# Patient Record
Sex: Male | Born: 1985 | Race: White | Hispanic: No | Marital: Married | Smoking: Former smoker
Health system: Southern US, Community
[De-identification: ages and names within clinical notes are randomized; demographics above are authoritative.]

---

## 2016-04-20 DIAGNOSIS — J351 Hypertrophy of tonsils: Secondary | ICD-10-CM | POA: Insufficient documentation

## 2016-04-20 DIAGNOSIS — R0683 Snoring: Secondary | ICD-10-CM | POA: Insufficient documentation

## 2016-06-19 DIAGNOSIS — J0100 Acute maxillary sinusitis, unspecified: Secondary | ICD-10-CM | POA: Diagnosis not present

## 2016-06-23 DIAGNOSIS — J320 Chronic maxillary sinusitis: Secondary | ICD-10-CM | POA: Diagnosis not present

## 2016-06-23 DIAGNOSIS — T360X5A Adverse effect of penicillins, initial encounter: Secondary | ICD-10-CM | POA: Diagnosis not present

## 2016-06-23 DIAGNOSIS — R197 Diarrhea, unspecified: Secondary | ICD-10-CM | POA: Diagnosis not present

## 2016-07-12 DIAGNOSIS — J020 Streptococcal pharyngitis: Secondary | ICD-10-CM | POA: Diagnosis not present

## 2016-09-08 DIAGNOSIS — J019 Acute sinusitis, unspecified: Secondary | ICD-10-CM | POA: Diagnosis not present

## 2016-09-08 DIAGNOSIS — J029 Acute pharyngitis, unspecified: Secondary | ICD-10-CM | POA: Diagnosis not present

## 2016-11-16 DIAGNOSIS — J3489 Other specified disorders of nose and nasal sinuses: Secondary | ICD-10-CM | POA: Diagnosis not present

## 2016-11-16 DIAGNOSIS — K529 Noninfective gastroenteritis and colitis, unspecified: Secondary | ICD-10-CM | POA: Diagnosis not present

## 2016-11-17 ENCOUNTER — Other Ambulatory Visit: Payer: Self-pay | Admitting: Physician Assistant

## 2016-11-17 DIAGNOSIS — J329 Chronic sinusitis, unspecified: Secondary | ICD-10-CM

## 2016-11-24 ENCOUNTER — Inpatient Hospital Stay
Admission: RE | Admit: 2016-11-24 | Discharge: 2016-11-24 | Disposition: A | Discharge status: Home / Self Care | Payer: Self-pay | Source: Ambulatory Visit | Attending: Physician Assistant | Admitting: Physician Assistant

## 2017-01-03 DIAGNOSIS — J029 Acute pharyngitis, unspecified: Secondary | ICD-10-CM | POA: Diagnosis not present

## 2017-07-26 DIAGNOSIS — R140 Abdominal distension (gaseous): Secondary | ICD-10-CM | POA: Diagnosis not present

## 2017-07-26 DIAGNOSIS — R197 Diarrhea, unspecified: Secondary | ICD-10-CM | POA: Diagnosis not present

## 2017-08-24 DIAGNOSIS — R21 Rash and other nonspecific skin eruption: Secondary | ICD-10-CM | POA: Diagnosis not present

## 2017-08-24 DIAGNOSIS — L239 Allergic contact dermatitis, unspecified cause: Secondary | ICD-10-CM | POA: Diagnosis not present

## 2017-08-24 DIAGNOSIS — K580 Irritable bowel syndrome with diarrhea: Secondary | ICD-10-CM | POA: Diagnosis not present

## 2017-09-11 DIAGNOSIS — J019 Acute sinusitis, unspecified: Secondary | ICD-10-CM | POA: Diagnosis not present

## 2018-03-09 DIAGNOSIS — J069 Acute upper respiratory infection, unspecified: Secondary | ICD-10-CM | POA: Diagnosis not present

## 2018-03-09 DIAGNOSIS — J019 Acute sinusitis, unspecified: Secondary | ICD-10-CM | POA: Diagnosis not present

## 2019-02-27 ENCOUNTER — Other Ambulatory Visit: Payer: Self-pay

## 2019-02-27 DIAGNOSIS — Z20822 Contact with and (suspected) exposure to covid-19: Secondary | ICD-10-CM

## 2019-02-28 LAB — NOVEL CORONAVIRUS, NAA: SARS-CoV-2, NAA: NOT DETECTED

## 2020-02-10 ENCOUNTER — Ambulatory Visit: Payer: Self-pay | Attending: Internal Medicine

## 2020-02-12 ENCOUNTER — Ambulatory Visit: Payer: Self-pay | Attending: Internal Medicine

## 2020-02-12 DIAGNOSIS — Z23 Encounter for immunization: Secondary | ICD-10-CM

## 2020-02-12 NOTE — Progress Notes (Signed)
   Covid-19 Vaccination Clinic  Name:  Brett Moody    MRN: [id number] DOB: 1985-07-07  02/12/2020  Brett Moody was observed post Covid-19 immunization for 15 minutes without incident. He was provided with Vaccine Information Sheet and instruction to access the V-Safe system.   Brett Moody was instructed to call 911 with any severe reactions post vaccine: Marland Kitchen Difficulty breathing  . Swelling of face and throat  . A fast heartbeat  . A bad rash all over body  . Dizziness and weakness   Immunizations Administered    Name Date Dose VIS Date Route   Pfizer COVID-19 Vaccine 02/12/2020  2:23 PM 0.3 mL 08/13/2018 Intramuscular   Manufacturer: [employer], [employer]   Lot: [id number]   NDC: [address]-6

## 2020-03-04 ENCOUNTER — Ambulatory Visit: Payer: Self-pay

## 2020-10-15 ENCOUNTER — Other Ambulatory Visit: Payer: Self-pay | Admitting: Nurse Practitioner

## 2020-10-15 ENCOUNTER — Other Ambulatory Visit: Payer: Self-pay

## 2020-10-15 ENCOUNTER — Ambulatory Visit
Admission: RE | Admit: 2020-10-15 | Discharge: 2020-10-15 | Disposition: A | Discharge status: Home / Self Care | Payer: Worker's Compensation | Source: Ambulatory Visit | Attending: Nurse Practitioner | Admitting: Nurse Practitioner

## 2020-10-15 DIAGNOSIS — T1490XA Injury, unspecified, initial encounter: Secondary | ICD-10-CM

## 2022-09-03 IMAGING — CR DG FINGER INDEX 2+V*L*
3 series · 3 of 3 positions shown · non-contrast
Comparison: None.

CLINICAL DATA: Pain

EXAM:
LEFT INDEX FINGER 2+V

[x finger pa left]
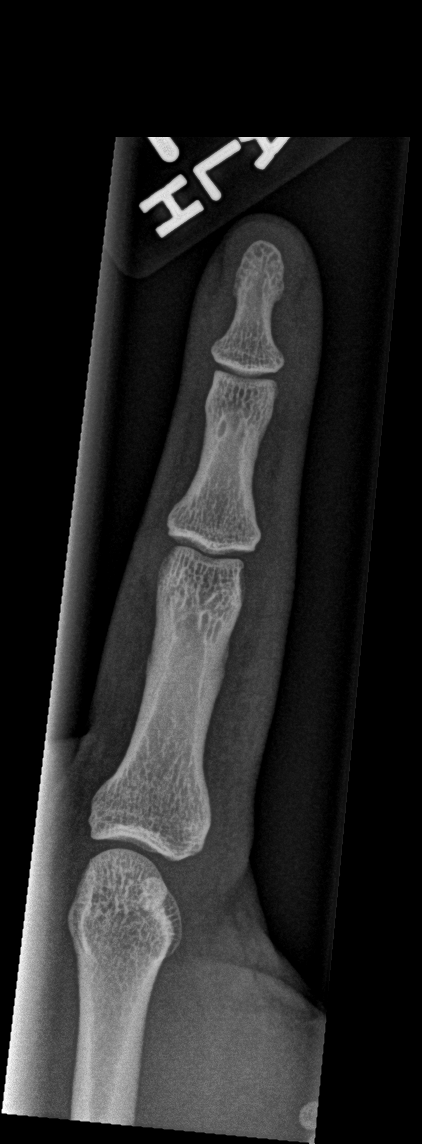

[x finger obl left]
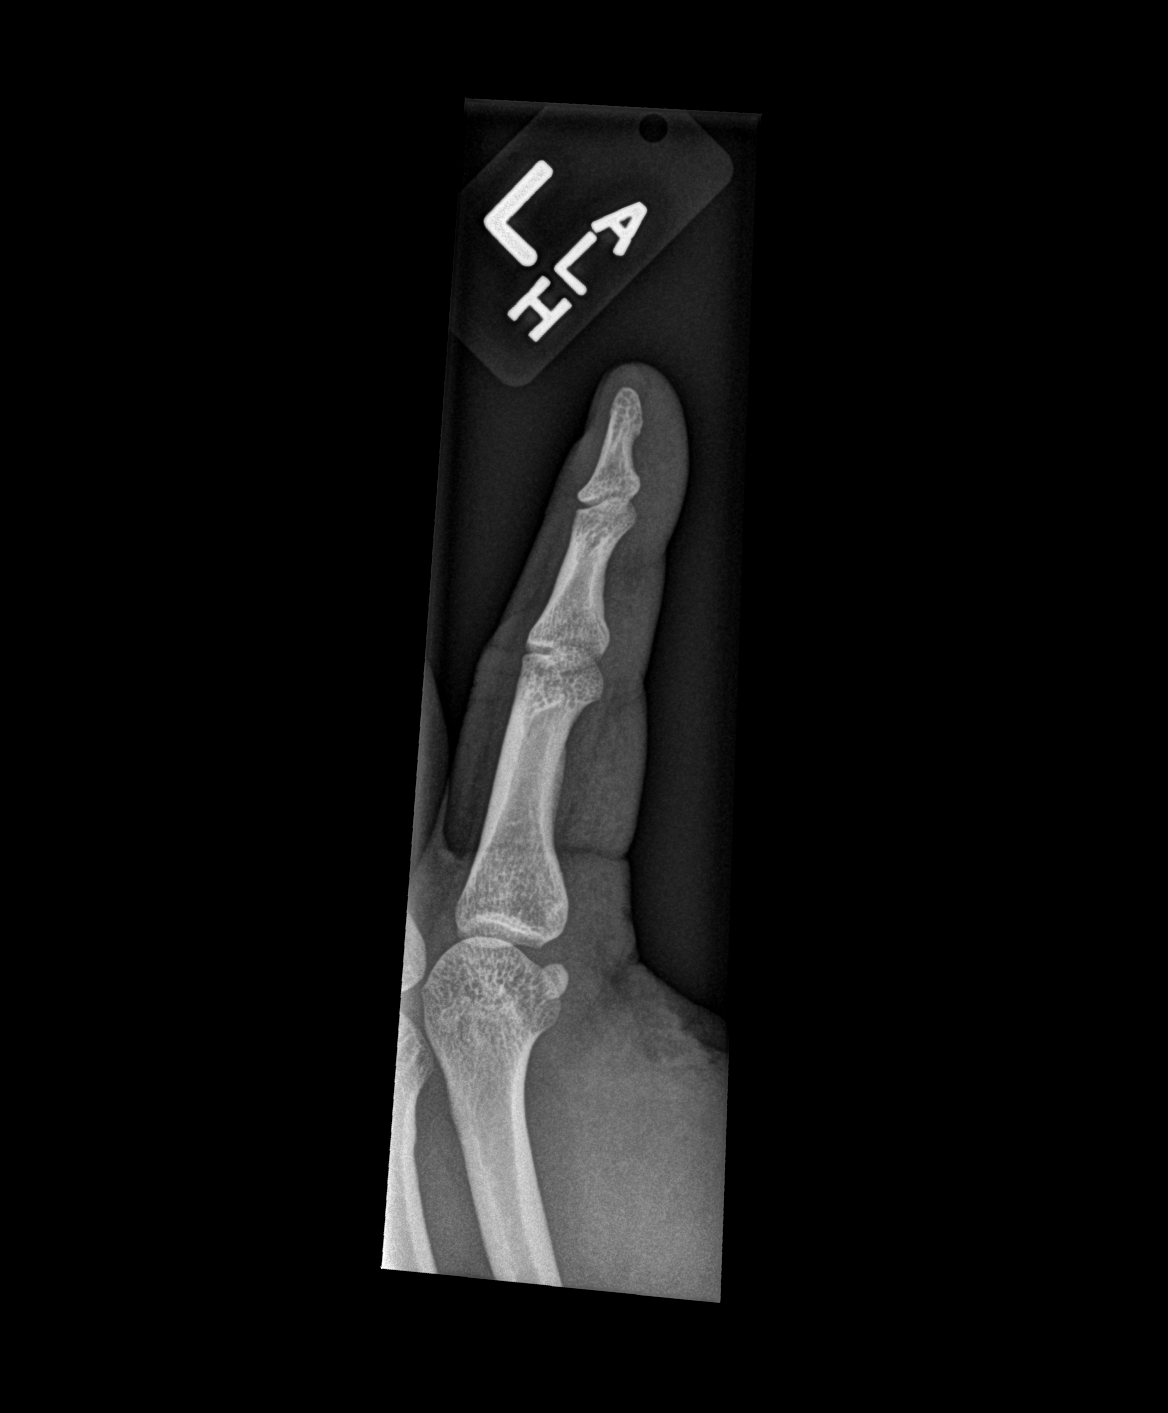

[x finger lat left]
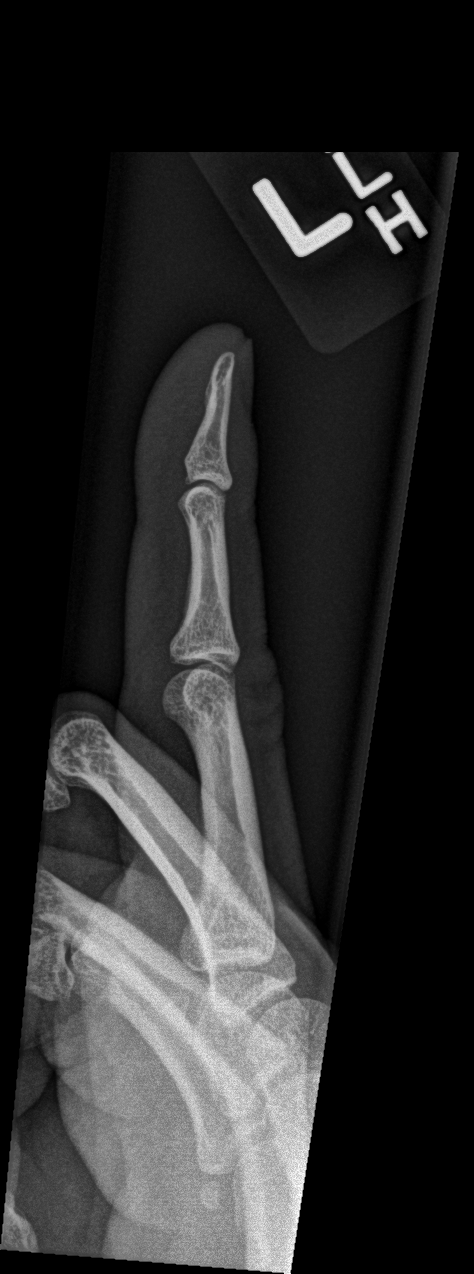

[3 of 3 positions shown; findings below may reference images not displayed]

FINDINGS: There is no evidence of fracture or dislocation. There is no
evidence of arthropathy or other focal bone abnormality. Soft
tissues are unremarkable.
IMPRESSION: Negative.

## 2023-03-27 ENCOUNTER — Ambulatory Visit: Payer: 59 | Admitting: Physician Assistant

## 2023-03-27 ENCOUNTER — Encounter: Payer: Self-pay | Admitting: Physician Assistant

## 2023-03-27 VITALS — BP 134/94 | HR 80 | Ht 71.5 in | Wt 269.0 lb

## 2023-03-27 DIAGNOSIS — E6609 Other obesity due to excess calories: Secondary | ICD-10-CM

## 2023-03-27 DIAGNOSIS — Z6836 Body mass index (BMI) 36.0-36.9, adult: Secondary | ICD-10-CM

## 2023-03-27 DIAGNOSIS — E66812 Obesity, class 2: Secondary | ICD-10-CM

## 2023-03-27 DIAGNOSIS — Z0000 Encounter for general adult medical examination without abnormal findings: Secondary | ICD-10-CM

## 2023-03-27 DIAGNOSIS — R030 Elevated blood-pressure reading, without diagnosis of hypertension: Secondary | ICD-10-CM

## 2023-03-27 DIAGNOSIS — R7303 Prediabetes: Secondary | ICD-10-CM

## 2023-03-27 NOTE — Progress Notes (Unsigned)
   New Patient Office Visit  Subjective    Patient ID: Brett Moody, male    DOB: June 16, 1986  Age: 37 y.o. MRN: [id number]  CC:  Chief Complaint  Patient presents with   Annual Exam    HPI Brett Moody presents to establish care No oncerns   PCP on 12/4 March  - higher LDL levels  Prediabetes   Daily exercise - active at work  Water intake -  120/80   Outpatient Encounter Medications as of 03/27/2023  Medication Sig   fexofenadine (ALLEGRA ALLERGY) 60 MG tablet Take 60 mg by mouth daily.   FLUoxetine (PROZAC) 20 MG tablet Take 20 mg by mouth daily.   No facility-administered encounter medications on file as of 03/27/2023.    No past medical history on file.  History reviewed. No pertinent surgical history.  Family History  Problem Relation Age of Onset   Diabetes Father    Diabetes Paternal Grandmother    Diabetes Paternal Grandfather     Social History   Socioeconomic History   Marital status: Married    Spouse name: Not on file   Number of children: Not on file   Years of education: Not on file   Highest education level: Not on file  Occupational History   Not on file  Tobacco Use   Smoking status: Former    Types: Cigarettes    Passive exposure: Past   Smokeless tobacco: Not on file  Vaping Use   Vaping status: Never Used  Substance and Sexual Activity   Alcohol use: Yes    Alcohol/week: 1.0 standard drink of alcohol    Types: 1 Cans of beer per week    Comment: Weekly   Drug use: Not on file   Sexual activity: Not on file  Other Topics Concern   Not on file  Social History Narrative   Not on file   Social Determinants of Health   Financial Resource Strain: Not on file  Food Insecurity: Not on file  Transportation Needs: Not on file  Physical Activity: Not on file  Stress: Not on file  Social Connections: Not on file  Intimate Partner Violence: Not on file    ROS      Objective    Ht 5' 11.5" (1.816 m)   Wt 269 lb (122 kg)    BMI 36.99 kg/m   Physical Exam       Assessment & Plan:   Problem List Items Addressed This Visit   None   No follow-ups on file.   Kasandra Knudsen Mayers, PA-C

## 2023-03-27 NOTE — Patient Instructions (Addendum)
Your blood pressure is slightly elevated today, I do encourage you to check it at home, keep a written log and time not available for your office visits with your primary care provider.  Please feel free to return to the mobile unit as needed.  Roney Jaffe, PA-C Physician Assistant [facility] [email or web address]  How to Take Your Blood Pressure Blood pressure is a measurement of how strongly your blood is pressing against the walls of your arteries. Arteries are blood vessels that carry blood from your heart throughout your body. Your health care provider takes your blood pressure at each office visit. You can also take your own blood pressure at home with a blood pressure monitor. You may need to take your own blood pressure to: Confirm a diagnosis of high blood pressure (hypertension). Monitor your blood pressure over time. Make sure your blood pressure medicine is working. Supplies needed: Blood pressure monitor. A chair to sit in. This should be a chair where you can sit upright with your back supported. Do not sit on a soft couch or an armchair. Table or desk. Small notebook and pencil or pen. How to prepare To get the most accurate reading, avoid the following for 30 minutes before you check your blood pressure: Drinking caffeine. Drinking alcohol. Eating. Smoking. Exercising. Five minutes before you check your blood pressure: Use the bathroom and urinate so that you have an empty bladder. Sit quietly in a chair. Do not talk. How to take your blood pressure To check your blood pressure, follow the instructions in the manual that came with your blood pressure monitor. If you have a digital blood pressure monitor, the instructions may be as follows: Sit up straight in a chair. Place your feet on the floor. Do not cross your ankles or legs. Rest your left arm at the level of your heart on a table or desk or on the arm of  a chair. Pull up your shirt sleeve. Wrap the blood pressure cuff around the upper part of your left arm, 1 inch (2.5 cm) above your elbow. It is best to wrap the cuff around bare skin. Fit the cuff snugly, but not too tightly, around your arm. You should be able to place only one finger between the cuff and your arm. Position the cord so that it rests in the bend of your elbow. Press the power button. Sit quietly while the cuff inflates and deflates. Read the digital reading on the monitor screen and write the numbers down (record them) in a notebook. Wait 2-3 minutes, then repeat the steps, starting at step 1. What does my blood pressure reading mean? A blood pressure reading consists of a higher number over a lower number. Ideally, your blood pressure should be below 120/80. The first ("top") number is called the systolic pressure. It is a measure of the pressure in your arteries as your heart beats. The second ("bottom") number is called the diastolic pressure. It is a measure of the pressure in your arteries as the heart relaxes. Blood pressure is classified into four stages. The following are the stages for adults who do not have a short-term serious illness or a chronic condition. Systolic pressure and diastolic pressure are measured in a unit called mm Hg (millimeters of mercury).  Normal Systolic pressure: below 120. Diastolic pressure: below 80. Elevated Systolic pressure: 120-129. Diastolic pressure: below 80. Hypertension stage 1 Systolic pressure: 130-139. Diastolic pressure: 80-89. Hypertension stage 2 Systolic pressure: 140 or above. Diastolic  pressure: 90 or above. You can have elevated blood pressure or hypertension even if only the systolic or only the diastolic number in your reading is higher than normal. Follow these instructions at home: Medicines Take over-the-counter and prescription medicines only as told by your health care provider. Tell your health care provider  if you are having any side effects from blood pressure medicine. General instructions Check your blood pressure as often as recommended by your health care provider. Check your blood pressure at the same time every day. Take your monitor to the next appointment with your health care provider to make sure that: You are using it correctly. It provides accurate readings. Understand what your goal blood pressure numbers are. Keep all follow-up visits. This is important. General tips Your health care provider can suggest a reliable monitor that will meet your needs. There are several types of home blood pressure monitors. Choose a monitor that has an arm cuff. Do not choose a monitor that measures your blood pressure from your wrist or finger. Choose a cuff that wraps snugly, not too tight or too loose, around your upper arm. You should be able to fit only one finger between your arm and the cuff. You can buy a blood pressure monitor at most drugstores or online. Where to find more information American Heart Association: www.heart.org Contact a health care provider if: Your blood pressure is consistently high. Your blood pressure is suddenly low. Get help right away if: Your systolic blood pressure is higher than 180. Your diastolic blood pressure is higher than 120. These symptoms may be an emergency. Get help right away. Call 911. Do not wait to see if the symptoms will go away. Do not drive yourself to the hospital. Summary Blood pressure is a measurement of how strongly your blood is pressing against the walls of your arteries. A blood pressure reading consists of a higher number over a lower number. Ideally, your blood pressure should be below 120/80. Check your blood pressure at the same time every day. Avoid caffeine, alcohol, smoking, and exercise for 30 minutes prior to checking your blood pressure. These agents can affect the accuracy of the blood pressure reading. This information  is not intended to replace advice given to you by your health care provider. Make sure you discuss any questions you have with your health care provider. Document Revised: 02/17/2021 Document Reviewed: 02/17/2021 Elsevier Patient Education  2024 [employer].

## 2023-03-29 ENCOUNTER — Encounter: Payer: Self-pay | Admitting: Physician Assistant

## 2024-04-22 ENCOUNTER — Encounter: Payer: Self-pay | Admitting: Urgent Care

## 2024-04-22 ENCOUNTER — Ambulatory Visit: Admitting: Urgent Care

## 2024-04-22 VITALS — BP 140/92 | HR 78 | Ht 72.0 in | Wt 277.0 lb

## 2024-04-22 DIAGNOSIS — R0683 Snoring: Secondary | ICD-10-CM | POA: Diagnosis not present

## 2024-04-22 DIAGNOSIS — R030 Elevated blood-pressure reading, without diagnosis of hypertension: Secondary | ICD-10-CM

## 2024-04-22 DIAGNOSIS — F329 Major depressive disorder, single episode, unspecified: Secondary | ICD-10-CM | POA: Insufficient documentation

## 2024-04-22 DIAGNOSIS — J3089 Other allergic rhinitis: Secondary | ICD-10-CM

## 2024-04-22 DIAGNOSIS — K580 Irritable bowel syndrome with diarrhea: Secondary | ICD-10-CM | POA: Insufficient documentation

## 2024-04-22 DIAGNOSIS — E785 Hyperlipidemia, unspecified: Secondary | ICD-10-CM | POA: Diagnosis not present

## 2024-04-22 DIAGNOSIS — J351 Hypertrophy of tonsils: Secondary | ICD-10-CM

## 2024-04-22 DIAGNOSIS — J309 Allergic rhinitis, unspecified: Secondary | ICD-10-CM | POA: Insufficient documentation

## 2024-04-22 DIAGNOSIS — B001 Herpesviral vesicular dermatitis: Secondary | ICD-10-CM

## 2024-04-22 DIAGNOSIS — F330 Major depressive disorder, recurrent, mild: Secondary | ICD-10-CM | POA: Diagnosis not present

## 2024-04-22 MED ORDER — FLUOXETINE HCL 10 MG PO CAPS: 10.0000 mg | ORAL_CAPSULE | Freq: Every day | ORAL | 3 refills | Status: AC

## 2024-04-22 MED ORDER — FEXOFENADINE HCL 60 MG PO TABS: 60.0000 mg | ORAL_TABLET | Freq: Every day | ORAL | 3 refills | Status: AC

## 2024-04-22 MED ORDER — VALACYCLOVIR HCL 1 G PO TABS: 2000.0000 mg | ORAL_TABLET | Freq: Two times a day (BID) | ORAL | 5 refills | Status: AC

## 2024-04-22 MED ORDER — FLUOXETINE HCL 20 MG PO TABS
20.0000 mg | ORAL_TABLET | Freq: Every day | ORAL | 1 refills | Status: DC
Start: 2024-04-22 — End: 2024-04-28

## 2024-04-22 NOTE — Progress Notes (Unsigned)
 SABRA

## 2024-04-22 NOTE — Progress Notes (Unsigned)
 New Patient Office Visit  Subjective:  Patient ID: Brett Moody, male    DOB: [birth year]  Age: 38 y.o. MRN: [id number]  CC:  Chief Complaint  Patient presents with   Establish Care    HPI Brett Moody presents to establish care.  Pleasant 38yo male presents to establish care. He does need medication refills. He is on fluoxetine and feels this is working well.   He does have allergies for which he takes allegra daily.  C/o being told his tonsils are enlarged. He admits to snoring.  Does have hx of cold sores, requiring infrequent use of valtrex.  Has elevated BP in office today; has not been on BP meds in the past.   Does have hx of HLD, not on meds   Outpatient Encounter Medications as of 04/22/2024  Medication Sig   FLUoxetine (PROZAC) 10 MG capsule Take 1 capsule (10 mg total) by mouth daily.   methocarbamol (ROBAXIN) 500 MG tablet 1 tablet Orally every 4-6 hrs as needed. Max 3 a day   [DISCONTINUED] fexofenadine (ALLEGRA ALLERGY) 60 MG tablet Take 60 mg by mouth daily.   [DISCONTINUED] FLUoxetine (PROZAC) 20 MG tablet Take 20 mg by mouth daily.   [DISCONTINUED] valACYclovir (VALTREX) 1000 MG tablet Take 2,000 mg by mouth 2 (two) times daily.   fexofenadine (ALLEGRA ALLERGY) 60 MG tablet Take 1 tablet (60 mg total) by mouth daily.   FLUoxetine (PROZAC) 20 MG tablet Take 1 tablet (20 mg total) by mouth daily.   valACYclovir (VALTREX) 1000 MG tablet Take 2 tablets (2,000 mg total) by mouth 2 (two) times daily.   No facility-administered encounter medications on file as of 04/22/2024.    History reviewed. No pertinent past medical history.  History reviewed. No pertinent surgical history.  Family History  Problem Relation Age of Onset   Diabetes Father    Diabetes Paternal Grandmother    Diabetes Paternal Grandfather     Social History   Socioeconomic History   Marital status: Married    Spouse name: Not on file   Number of children: Not on file   Years of  education: Not on file   Highest education level: Not on file  Occupational History   Not on file  Tobacco Use   Smoking status: Former    Types: Cigarettes    Passive exposure: Past   Smokeless tobacco: Never  Vaping Use   Vaping status: Never Used  Substance and Sexual Activity   Alcohol use: Yes    Alcohol/week: 1.0 standard drink of alcohol    Types: 1 Cans of beer per week    Comment: Weekly   Drug use: Not on file   Sexual activity: Not on file  Other Topics Concern   Not on file  Social History Narrative   Not on file   Social Drivers of Health   Financial Resource Strain: Not on file  Food Insecurity: Not on file  Transportation Needs: Not on file  Physical Activity: Not on file  Stress: Not on file  Social Connections: Not on file  Intimate Partner Violence: Not on file    ROS: as noted in HPI  Objective:  BP (!) 140/92   Pulse 78   Ht 6' (1.829 m)   Wt 277 lb (125.6 kg)   SpO2 96%   BMI 37.57 kg/m   Physical Exam    Assessment & Plan:  Mild episode of recurrent major depressive disorder -     FLUoxetine HCl; Take  1 tablet (20 mg total) by mouth daily.  Dispense: 90 tablet; Refill: 1 -     FLUoxetine HCl; Take 1 capsule (10 mg total) by mouth daily.  Dispense: 90 capsule; Refill: 3  Allergic rhinitis due to other allergic trigger, unspecified seasonality -     Fexofenadine HCl; Take 1 tablet (60 mg total) by mouth daily.  Dispense: 90 tablet; Refill: 3  Snoring -     Ambulatory referral to Sleep Studies  Dyslipidemia  Hypertrophy of tonsils -     Ambulatory referral to Sleep Studies  Recurrent cold sores -     valACYclovir HCl; Take 2 tablets (2,000 mg total) by mouth 2 (two) times daily.  Dispense: 30 tablet; Refill: 5  Elevated blood pressure reading  Med refills called in. PT will monitor BP at home over the next week and bring home BP readings to next office visit. Pt will come fasting. Will refer for sleep study given risk  factors.   Return in about 1 week (around 04/29/2024) for Annual Physical.   Benton LITTIE Gave, PA

## 2024-04-22 NOTE — Patient Instructions (Signed)
 Schedule annual PE next week

## 2024-04-28 ENCOUNTER — Other Ambulatory Visit: Payer: Self-pay | Admitting: Urgent Care

## 2024-04-28 ENCOUNTER — Ambulatory Visit: Admitting: Urgent Care

## 2024-04-28 ENCOUNTER — Encounter: Payer: Self-pay | Admitting: Urgent Care

## 2024-04-28 VITALS — BP 122/86 | HR 71

## 2024-04-28 DIAGNOSIS — Z0000 Encounter for general adult medical examination without abnormal findings: Secondary | ICD-10-CM

## 2024-04-28 DIAGNOSIS — Z1159 Encounter for screening for other viral diseases: Secondary | ICD-10-CM

## 2024-04-28 DIAGNOSIS — R7303 Prediabetes: Secondary | ICD-10-CM | POA: Diagnosis not present

## 2024-04-28 DIAGNOSIS — Z114 Encounter for screening for human immunodeficiency virus [HIV]: Secondary | ICD-10-CM

## 2024-04-28 DIAGNOSIS — E782 Mixed hyperlipidemia: Secondary | ICD-10-CM

## 2024-04-28 DIAGNOSIS — E669 Obesity, unspecified: Secondary | ICD-10-CM | POA: Diagnosis not present

## 2024-04-28 DIAGNOSIS — Z23 Encounter for immunization: Secondary | ICD-10-CM | POA: Diagnosis not present

## 2024-04-28 DIAGNOSIS — E8881 Metabolic syndrome: Secondary | ICD-10-CM

## 2024-04-28 DIAGNOSIS — Z6837 Body mass index (BMI) 37.0-37.9, adult: Secondary | ICD-10-CM

## 2024-04-28 MED ORDER — FLUOXETINE HCL 20 MG PO CAPS: 20.0000 mg | ORAL_CAPSULE | Freq: Every day | ORAL | 3 refills | Status: AC

## 2024-04-28 NOTE — Patient Instructions (Addendum)
 Please read the attached handouts.  Please try to incorporate more physical activity into your daily plan.  Return for recheck and follow up in 6 months.

## 2024-04-28 NOTE — Progress Notes (Signed)
 Received Rx request from [facility]. Tablets are NOT covered, only capsules are. Pt takes 30mg  fluoxetine daily. Unfortunately, the 20mg  tablet was called in therefore will switch today to capsules per insurance request. Pt will remain on 10mg  and 20mg  capsules to = 30mg  daily.

## 2024-04-28 NOTE — Progress Notes (Unsigned)
 Annual Wellness Visit     Patient: Brett Moody, Male    DOB: 1985/11/27, 38 y.o.   MRN: [id number]  Subjective  Chief Complaint  Patient presents with   Annual Exam    Brett Moody is a 38 y.o. male who presents today for his Annual Wellness Visit. He reports consuming a general diet but feels he eats out more than he should. The patient does not participate in regular exercise at present. 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 He generally feels fairly well. He reports sleeping fairly well. He does not have additional problems to discuss today.   HPI  Vision:Within last year and Dental: No current dental problems and Receives regular dental care   Patient Active Problem List   Diagnosis Date Noted   Dyslipidemia 04/22/2024   Allergic rhinitis 04/22/2024   Major depression 04/22/2024   Irritable bowel syndrome with diarrhea 04/22/2024   Hypertrophy of tonsils 04/20/2016   Snoring 04/20/2016   History reviewed. No pertinent past medical history. History reviewed. No pertinent surgical history. Social History   Tobacco Use   Smoking status: Former    Types: Cigarettes    Passive exposure: Past   Smokeless tobacco: Never  Vaping Use   Vaping status: Never Used  Substance Use Topics   Alcohol use: Yes    Alcohol/week: 1.0 standard drink of alcohol    Types: 1 Cans of beer per week    Comment: Weekly      Medications: Outpatient Medications Prior to Visit  Medication Sig   fexofenadine (ALLEGRA ALLERGY) 60 MG tablet Take 1 tablet (60 mg total) by mouth daily.   FLUoxetine (PROZAC) 10 MG capsule Take 1 capsule (10 mg total) by mouth daily.   FLUoxetine (PROZAC) 20 MG capsule Take 1 capsule (20 mg total) by mouth daily.   methocarbamol (ROBAXIN) 500 MG tablet 1 tablet Orally every 4-6 hrs as needed. Max 3 a day   valACYclovir (VALTREX) 1000 MG tablet Take 2 tablets (2,000 mg total) by mouth 2 (two) times daily.   No facility-administered  medications prior to visit.    No Known Allergies  Patient Care Team: Lowella Benton CROME, [place] as PCP - General (Physician Assistant)  ROS Complete 12 point ROS performed with all pertinent positives listed in HPI     Objective  BP 122/86   Pulse 71   SpO2 95%  BP Readings from Last 3 Encounters:  04/28/24 122/86  04/22/24 (!) 140/92  03/27/23 (!) 134/94   Wt Readings from Last 3 Encounters:  04/22/24 277 lb (125.6 kg)  03/27/23 269 lb (122 kg)      Physical Exam Vitals and nursing note reviewed. Exam conducted with a chaperone present.  Constitutional:      General: He is not in acute distress.    Appearance: Normal appearance. He is obese. He is not ill-appearing, toxic-appearing or diaphoretic.  HENT:     Head: Normocephalic and atraumatic.     Right Ear: Tympanic membrane, ear canal and external ear normal. There is no impacted cerumen.     Left Ear: Tympanic membrane, ear canal and external ear normal. There is no impacted cerumen.     Nose: Nose normal.     Mouth/Throat:     Mouth: Mucous membranes are moist.     Pharynx: Oropharynx is clear. No oropharyngeal exudate or posterior oropharyngeal erythema.  Eyes:     General: No scleral icterus.  Right eye: No discharge.        Left eye: No discharge.     Extraocular Movements: Extraocular movements intact.     Pupils: Pupils are equal, round, and reactive to light.  Neck:     Thyroid: No thyroid mass, thyromegaly or thyroid tenderness.  Cardiovascular:     Rate and Rhythm: Normal rate and regular rhythm.     Pulses: Normal pulses.     Heart sounds: No murmur heard. Pulmonary:     Effort: Pulmonary effort is normal. No respiratory distress.     Breath sounds: Normal breath sounds. No stridor. No wheezing or rhonchi.  Abdominal:     General: Abdomen is flat. Bowel sounds are normal. There is no distension.     Palpations: Abdomen is soft. There is no mass.     Tenderness: There is no abdominal tenderness.  There is no guarding.  Musculoskeletal:     Cervical back: Normal range of motion and neck supple. No rigidity or tenderness.     Right lower leg: No edema.     Left lower leg: No edema.  Lymphadenopathy:     Cervical: No cervical adenopathy.  Skin:    General: Skin is warm and dry.     Coloration: Skin is not jaundiced.     Findings: No bruising, erythema or rash.  Neurological:     General: No focal deficit present.     Mental Status: He is alert and oriented to person, place, and time.     Sensory: No sensory deficit.     Motor: No weakness.  Psychiatric:        Mood and Affect: Mood normal.        Behavior: Behavior normal.     Most recent depression screenings:    04/28/2024    1:49 PM 04/22/2024    3:32 PM  PHQ 2/9 Scores  PHQ - 2 Score 1 0  PHQ- 9 Score 4 0      Data saved with a previous flowsheet row definition    Fall Screening Falls in the past year?: 0 Number of falls in past year: 0  Fall Risk Fall risk Follow up: Falls evaluation completed  Advance Directives (For Healthcare) Does Patient Have a Medical Advance Directive?: No Would patient like information on creating a medical advance directive?: No - Patient declined   Vision/Hearing Screen: No results found.   LDL - 137 TG  - 256 HDL - 29 Total cholesterol - 217 A1C - 6.1%   No results found for any visits on 04/28/24.    Assessment & Plan   Annual wellness visit done today including the all of the following: Reviewed patient's Family and Medical History Reviewed and updated list of patient's medical providers Assessment of cognitive impairment was done Assessed patient's functional ability Established a written schedule for health screening services Health Risk Assessent Completed and Reviewed  Exercise Activities and Dietary recommendations  Goals   Current goal - cut down to less than 2 sodas a day     Immunization History  Administered Date(s) Administered    PFIZER(Purple Top)SARS-COV-2 Vaccination 02/12/2020   Tdap 04/28/2024    Health Maintenance  Topic Date Due   HIV Screening  Never done   Hepatitis C Screening  Never done   COVID-19 Vaccine (3 - 2025-26 season) 05/09/2024 (Originally 02/18/2024)   Influenza Vaccine  09/16/2024 (Originally 01/18/2024)   Hepatitis B Vaccines 19-59 Average Risk (1 of 3 - 19+ 3-dose series) 04/28/2025 (  Originally 10/29/2003)   DTaP/Tdap/Td (2 - Td or Tdap) 04/28/2034   Pneumococcal Vaccine  Aged Out   Meningococcal B Vaccine  Aged Out   HPV VACCINES  Discontinued     Discussed health benefits of physical activity, and encouraged him to engage in regular exercise appropriate for his age and condition.    Problem List Items Addressed This Visit   None Visit Diagnoses       Routine adult health maintenance    -  Primary   Relevant Orders   CBC with Differential/Platelet   Hemoglobin A1c   TSH   Comprehensive metabolic panel with GFR     Need for diphtheria-tetanus-pertussis (Tdap) vaccine       Relevant Orders   Tdap vaccine greater than or equal to 7yo IM (Completed)     Encounter for screening for HIV       Relevant Orders   HIV antibody (with reflex)     Need for hepatitis C screening test       Relevant Orders   Hepatitis C Antibody     Prediabetes       Relevant Orders   Hemoglobin A1c   Comprehensive metabolic panel with GFR     Abdominal obesity and metabolic syndrome       Relevant Orders   Comprehensive metabolic panel with GFR     BMI 37.0-37.9, adult       Relevant Orders   Hemoglobin A1c     Mixed hyperlipidemia       Relevant Orders   Hemoglobin A1c   Comprehensive metabolic panel with GFR      Assessment and Plan  Annual physical completed today. Discussed metabolic syndrome and need to increase physical activity and decrease simple sugars, especially in soda. Labs obtained today.  Return in about 6 months (around 10/26/2024).     Benton LITTIE Gave, PA

## 2024-04-29 ENCOUNTER — Ambulatory Visit: Payer: Self-pay | Admitting: Urgent Care

## 2024-04-29 LAB — COMPREHENSIVE METABOLIC PANEL WITH GFR
ALT: 76 IU/L — ABNORMAL HIGH (ref 0–44)
AST: 48 IU/L — ABNORMAL HIGH (ref 0–40)
Albumin: 4.6 g/dL (ref 4.1–5.1)
Alkaline Phosphatase: 62 IU/L (ref 47–123)
BUN/Creatinine Ratio: 14 (ref 9–20)
BUN: 13 mg/dL (ref 6–20)
Bilirubin Total: 0.6 mg/dL (ref 0.0–1.2)
CO2: 22 mmol/L (ref 20–29)
Calcium: 9.6 mg/dL (ref 8.7–10.2)
Chloride: 100 mmol/L (ref 96–106)
Creatinine, Ser: 0.9 mg/dL (ref 0.76–1.27)
Globulin, Total: 2.5 g/dL (ref 1.5–4.5)
Glucose: 160 mg/dL — ABNORMAL HIGH (ref 70–99)
Potassium: 3.9 mmol/L (ref 3.5–5.2)
Sodium: 138 mmol/L (ref 134–144)
Total Protein: 7.1 g/dL (ref 6.0–8.5)
eGFR: 111 mL/min/1.73 (ref >59)

## 2024-04-29 LAB — CBC WITH DIFFERENTIAL/PLATELET
Basophils Absolute: 0 x10E3/uL (ref 0.0–0.2)
Basos: 0 %
EOS (ABSOLUTE): 0.1 x10E3/uL (ref 0.0–0.4)
Eos: 2 %
Hematocrit: 48.2 % (ref 37.5–51.0)
Hemoglobin: 16.3 g/dL (ref 13.0–17.7)
Immature Grans (Abs): 0 x10E3/uL (ref 0.0–0.1)
Immature Granulocytes: 0 %
Lymphocytes Absolute: 1.4 x10E3/uL (ref 0.7–3.1)
Lymphs: 25 %
MCH: 31.4 pg (ref 26.6–33.0)
MCHC: 33.8 g/dL (ref 31.5–35.7)
MCV: 93 fL (ref 79–97)
Monocytes Absolute: 0.3 x10E3/uL (ref 0.1–0.9)
Monocytes: 4 %
Neutrophils Absolute: 3.8 x10E3/uL (ref 1.4–7.0)
Neutrophils: 69 %
Platelets: 233 x10E3/uL (ref 150–450)
RBC: 5.19 x10E6/uL (ref 4.14–5.80)
RDW: 12.7 % (ref 11.6–15.4)
WBC: 5.6 x10E3/uL (ref 3.4–10.8)

## 2024-04-29 LAB — TSH: TSH: 1.11 u[IU]/mL (ref 0.450–4.500)

## 2024-04-29 LAB — HEPATITIS C ANTIBODY: Hep C Virus Ab: NONREACTIVE

## 2024-04-29 LAB — HEMOGLOBIN A1C
Est. average glucose Bld gHb Est-mCnc: 134 mg/dL
Hgb A1c MFr Bld: 6.3 % — ABNORMAL HIGH (ref 4.8–5.6)

## 2024-04-29 LAB — HIV ANTIBODY (ROUTINE TESTING W REFLEX): HIV Screen 4th Generation wRfx: NONREACTIVE

## 2024-07-13 ENCOUNTER — Telehealth: Admitting: Family

## 2024-07-13 DIAGNOSIS — J069 Acute upper respiratory infection, unspecified: Secondary | ICD-10-CM | POA: Diagnosis not present

## 2024-07-13 MED ORDER — AMOXICILLIN-POT CLAVULANATE 875-125 MG PO TABS: 1.0000 | ORAL_TABLET | Freq: Two times a day (BID) | ORAL | 0 refills | Status: AC

## 2024-07-13 MED ORDER — CETIRIZINE HCL 10 MG PO TABS: 10.0000 mg | ORAL_TABLET | Freq: Every day | ORAL | 1 refills | Status: AC

## 2024-07-13 MED ORDER — FLUTICASONE PROPIONATE 50 MCG/ACT NA SUSP: 2.0000 | Freq: Every day | NASAL | 6 refills | Status: AC

## 2024-07-13 NOTE — Addendum Note (Signed)
 Addended by: LAVELL LYE A on: 07/13/2024 12:45 PM   Modules accepted: Orders

## 2024-07-13 NOTE — Progress Notes (Signed)

## 2024-07-15 ENCOUNTER — Encounter

## 2024-07-15 ENCOUNTER — Ambulatory Visit: Admitting: Urgent Care

## 2024-07-15 VITALS — BP 136/92 | HR 69 | Temp 98.1°F | Ht 72.0 in | Wt 283.0 lb

## 2024-07-15 DIAGNOSIS — H6592 Unspecified nonsuppurative otitis media, left ear: Secondary | ICD-10-CM

## 2024-07-15 MED ORDER — PREDNISONE 50 MG PO TABS: 50.0000 mg | ORAL_TABLET | Freq: Every day | ORAL | 0 refills | Status: AC

## 2024-07-15 NOTE — Patient Instructions (Addendum)
 Please start prednisone  once daily, tomorrow morning with breakfast.  Use Flonase  daily to help with inflammation of the nasal passage.  It is also recommended that you use nasal saline/ sinus washes to cleans the sinus passages.  I recommend [employer] aerosol saline spray to flush out the sinus passage. You should feel it in the back of your throat and hear your ear pop.  Hot steam from a shower or vaporizer may also be beneficial to help open up the upper airway. Eucalyptus can be helpful.

## 2024-07-15 NOTE — Progress Notes (Unsigned)
" ° °  Established Patient Office Visit  Subjective:  Patient ID: Brett Moody, male    DOB: 1985/10/11  Age: 39 y.o. MRN: [id number]  Chief Complaint  Patient presents with   Ear Pain    Left side of face and neck; taking a zpak since 07/13/24    HPI  Discussed the use of AI scribe software for clinical note transcription with the patient, who gave verbal consent to proceed.  History of Present Illness   Brett Moody is a 39 year old male with a history of frequent sinus infections who presents with ear pain and congestion.  He has been experiencing severe ear pain primarily on the left side since Sunday night, which has disrupted his sleep. The pain is described as 'dreadfully painful' and feels like 'there's an ice pick in it.' He has been using earache drops, oil, and warm compresses to alleviate the pain.  He has a history of frequent sinus infections, occurring at least a couple of times a year, and is currently experiencing some sinus congestion. He is able to breathe clearly through his sinuses and has no fever. No sneezing, and he can smell and taste normally.  He is currently on day three of a five-day course of azithromycin (Z-Pak) that he had at home, which he started after experiencing severe pain. He is also taking Flonase , two sprays in each nostril daily, and Allegra  daily for his allergies. He uses valacyclovir  for cold sores, which he had a minor outbreak of last week.  In his social history, everyone in his household is working or schooling remotely, and he anticipates needing to return to work soon.       {History (Optional):23778}  ROS: as noted in HPI  Objective:     BP (!) 136/92   Pulse 69   Temp 98.1 F (36.7 C)   Ht 6' (1.829 m)   Wt 283 lb (128.4 kg)   SpO2 94%   BMI 38.38 kg/m  {Vitals History (Optional):23777}  Physical Exam   No results found for any visits on 07/15/24.  {Labs (Optional):23779}  The ASCVD Risk score (Arnett DK, et  al., 2019) failed to calculate for the following reasons:   The 2019 ASCVD risk score is only valid for ages 49 to 30   * - Cholesterol units were assumed  Assessment & Plan:  There are no diagnoses linked to this encounter.   No follow-ups on file.   Benton LITTIE Gave, PA "

## 2024-07-16 ENCOUNTER — Encounter: Payer: Self-pay | Admitting: Urgent Care

## 2024-10-27 ENCOUNTER — Ambulatory Visit: Admitting: Urgent Care
